# Patient Record
Sex: Female | Born: 1985
Health system: Southern US, Community
[De-identification: ages and names within clinical notes are randomized; demographics above are authoritative.]

## PROBLEM LIST (undated history)

## (undated) DIAGNOSIS — R87629 Unspecified abnormal cytological findings in specimens from vagina: Secondary | ICD-10-CM

---
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
## 2006-03-07 ENCOUNTER — Emergency Department (HOSPITAL_COMMUNITY): Admission: EM | Admit: 2006-03-07 | Discharge: 2006-03-07 | Payer: Self-pay | Admitting: Emergency Medicine

## 2006-08-13 ENCOUNTER — Other Ambulatory Visit: Admission: RE | Admit: 2006-08-13 | Discharge: 2006-08-13 | Payer: Self-pay | Admitting: Obstetrics and Gynecology

## 2008-04-15 HISTORY — PX: FRACTURE SURGERY: SHX138

## 2008-05-01 ENCOUNTER — Inpatient Hospital Stay (HOSPITAL_COMMUNITY): Admission: EM | Admit: 2008-05-01 | Discharge: 2008-05-06 | Payer: Self-pay | Admitting: Emergency Medicine

## 2008-06-15 ENCOUNTER — Ambulatory Visit (HOSPITAL_COMMUNITY): Admission: RE | Admit: 2008-06-15 | Discharge: 2008-06-15 | Payer: Self-pay | Admitting: Orthopedic Surgery

## 2010-04-25 LAB — HEMOGLOBIN AND HEMATOCRIT, BLOOD
HCT: 37.4 % (ref 36.0–46.0)
Hemoglobin: 12.8 g/dL (ref 12.0–15.0)

## 2010-04-26 LAB — COMPREHENSIVE METABOLIC PANEL
Albumin: 2.6 g/dL — ABNORMAL LOW (ref 3.5–5.2)
BUN: 7 mg/dL (ref 6–23)
Calcium: 8.7 mg/dL (ref 8.4–10.5)
Creatinine, Ser: 0.65 mg/dL (ref 0.4–1.2)
Potassium: 4.1 mEq/L (ref 3.5–5.1)
Total Protein: 5.6 g/dL — ABNORMAL LOW (ref 6.0–8.3)

## 2010-04-26 LAB — CBC
HCT: 32.5 % — ABNORMAL LOW (ref 36.0–46.0)
MCV: 91.5 fL (ref 78.0–100.0)
Platelets: 222 10*3/uL (ref 150–400)
RDW: 12.1 % (ref 11.5–15.5)

## 2010-04-26 LAB — URINE MICROSCOPIC-ADD ON

## 2010-04-26 LAB — URINALYSIS, ROUTINE W REFLEX MICROSCOPIC
Glucose, UA: NEGATIVE mg/dL
Protein, ur: 30 mg/dL — AB
pH: 6 (ref 5.0–8.0)

## 2010-05-30 NOTE — Consult Note (Signed)
[name] [name], [name] [name]               ACCOUNT NO.:  [id number]   MEDICAL RECORD NO.:  [id number]          PATIENT TYPE:  INP   LOCATION:  1611                         FACILITY:  [facility]   PHYSICIAN:  Pramod P. Pearlean Brownie, MD    DATE OF BIRTH:  1985/07/25   DATE OF CONSULTATION:  05/06/2008  DATE OF DISCHARGE:                                 CONSULTATION   REASON FOR REFERRAL:  Involuntary head, neck and face movements.   HISTORY OF PRESENT ILLNESS:  Ms. Tyer is a 25 year old pleasant  Caucasian lady who was admitted for elective surgery for a closed  displaced distal tibial and fibular fracture on the right.  She  underwent an intramedullary nailing surgery on 05/02/2008.  She was  getting ready to be discharged today, when at 11:30 a.m. she had noticed  involuntary head and neck movements to the left, as well as some  intermittent jaw and tongue movements as well.  She stated that this  movement is involuntary and she can voluntarily move her head and face  back to the right, but it does not stay there and gets pulled to the  left.  She has notable speaking, swallowing and eating.  She also had  some increased tone in the right thigh earlier, which seems to have now  resolved.  She has no prior history of similar episodes.  She did get  some Reglan 10 mg yesterday for nausea and has been getting narcotics  for pain.   PAST MEDICAL HISTORY:  Significant only for migraine headaches, for  which she has seen Dr. Levert Feinstein in the office.  She was on Topamax for  a short period of time, but has been off of it now for a long time.  She  did have an outpatient MRI scan done in the office, which was  unremarkable.   REVIEW OF SYSTEMS:  Positive for involuntary movement and pain.  No  chest pain, shortness of breath or diarrhea.   PHYSICAL EXAMINATION:  GENERAL:  A young Caucasian lady, who appears to  be in distress.  NEUROLOGIC:  She has intermittent involuntary head movements to the left  with increased tone in the sternocleidomastoid.  The eyes also deviate  at times to the left, but she had easily voluntarily move the eyes back  past the midline.  She has an intermittent jaw dystonia with jaw  division to the left as well.  She has some mild tongue deviation but  she can easily protrude her tongue to command.  She is able to speak  with a soft voice.  She follows commands well.  She has no extremity  dystonia or  weakness.  She moves all four extremities well.  She has a  post-surgical bandage on the left leg.  CARDIAC:  Reveals heart sounds.  LUNGS:  Clear to auscultation.   DATA:  Recent hospital chart was reviewed.  The previous office notes  from Dr. Terrace Arabia were reviewed.   IMPRESSION:  A 25 year old lady with sudden onset of involuntary head  and neck movements to the left, with some intermittent  jaw dystonia as  well.  This likely an acute dystonic reaction to Reglan, which she got  yesterday.   PLAN:  I would recommend IV Cogentin 2 mg x1 now.  If she has recurrent  persistent dystonia, may start Benadryl 25 mg three times daily.  Avoid  antipsychotics and antiemetics in the future.   I will be happy to follow the patient.  Please call for questions.  She  can follow up electively with Dr. Terrace Arabia for her headaches.           ______________________________  Sunny Schlein. Pearlean Brownie, MD     PPS/MEDQ  D:  05/06/2008  T:  05/06/2008  Job:  [id number]
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
## 2010-05-30 NOTE — Discharge Summary (Signed)
[name] [name], [name] [name]               ACCOUNT NO.:  [id number]   MEDICAL RECORD NO.:  [id number]          PATIENT TYPE:  INP   LOCATION:  1611                         FACILITY:  [facility]   PHYSICIAN:  Marlowe Kays, M.D.  DATE OF BIRTH:  08/17/1985   DATE OF ADMISSION:  05/01/2008  DATE OF DISCHARGE:  05/06/2008                               DISCHARGE SUMMARY   ADDENDUM:  This is an addendum to the discharge summary that was  dictated earlier today.   [name] [name] was to have been discharged late morning today to her home.  Her  sister had flown in from [place] to be with her.  After finishing  assisting surgery with Dr. Simonne Come, I was paged and called to the  floor to check out the patient, as she was having some sort of muscle  spasms which caused her head to go into hyperextension and eyes pointing  to the left that she could not control.  I came upstairs.  Her mother  and older sister were in the room.  [name] [name] was lying on the right side  in a recumbent type position, and her sister was seated on the bed,  stroking her hair.  When I walked into the room and spoke to her, she  was able to bring her eyes down and focus on me, and then they  immediately went back up.  Ocular examination reveals no difficulty with  ocular movements following moving light.  All parameters were checked.  She was awake and alert and oriented x3.  She stated that she had no  control over this type spasm.  However, she could put her chin to the  chest with no difficulty.  However, the spasms pulled her head back  into the same position.  Mother and sister were, as one would expect,  quite concerned about this.  I learned that [name] [name] had been seen by Dr.  Terrace Arabia  at [facility] and had actually called their office and  had an appointment to see Dr. Terrace Arabia for her head problems on May 25, 2008.   I felt that since she had these spasm-like situations, and already had  been seen by a neurologist, that it  would be in the patient's best  interest to be seen by a neurologist prior to discharge.  The mother  agreed.  We will therefore hold her discharge until she is seen and  cleared by a neurologist for discharge home.  There is no plan in the  orthopedic plan previously dictated.  If Dr. Pearlean Brownie who is on call clears  her, then she can be discharged home later today, and this will serve as  an addendum to her discharge summary with no change in the previous  summary dictated.      Dooley L. Cherlynn June.    ______________________________  Marlowe Kays, M.D.    DLU/MEDQ  D:  05/06/2008  T:  05/06/2008  Job:  [id number]   cc:   Levert Feinstein, MD
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
## 2010-06-02 NOTE — Discharge Summary (Signed)
NAMEBRETTNEY, [name] [name]               ACCOUNT NO.:  [id number]   MEDICAL RECORD NO.:  [id number]          PATIENT TYPE:  INP   LOCATION:  1611                         FACILITY:  [facility]   PHYSICIAN:  Marlowe Kays, M.D.  DATE OF BIRTH:  [birth year]   DATE OF ADMISSION:  05/01/2008  DATE OF DISCHARGE:  05/06/2008                               DISCHARGE SUMMARY   ADDENDUM:  After my last addendum Dr. Pearlean Brownie did come and consult with  the patient, [name].  He felt that the involuntary head and neck  movement to the left knee primarily was a acute dystonic reaction to the  Reglan that was given to her for her nausea.  He treated her with IV  Cogentin 1 dose and for persistent dystonia Benadryl 25 mg t.i.d.  He  recommended she avoid antipsychotics and antiemetics in the future.  He  offered her follow up to his office and to follow-up with Dr. Terrace Arabia at  [facility]. The original plan on the original discharge summary  is as dictated.      Dooley L. Cherlynn June.    ______________________________  Marlowe Kays, M.D.    DLU/MEDQ  D:  05/07/2008  T:  05/07/2008  Job:  [id number]   cc:   Levert Feinstein, MD   Marlowe Kays, M.D.  Fax: [phone]

## 2011-02-08 IMAGING — RF DG ANKLE 2V *R*
1 series · 6 of 6 positions shown · non-contrast
Comparison: 05/01/2008

CLINICAL DATA: Ankle fracture

RIGHT ANKLE - 2 VIEW

[Series 1: run · 6 of 6 slices shown]
[im 1/6]
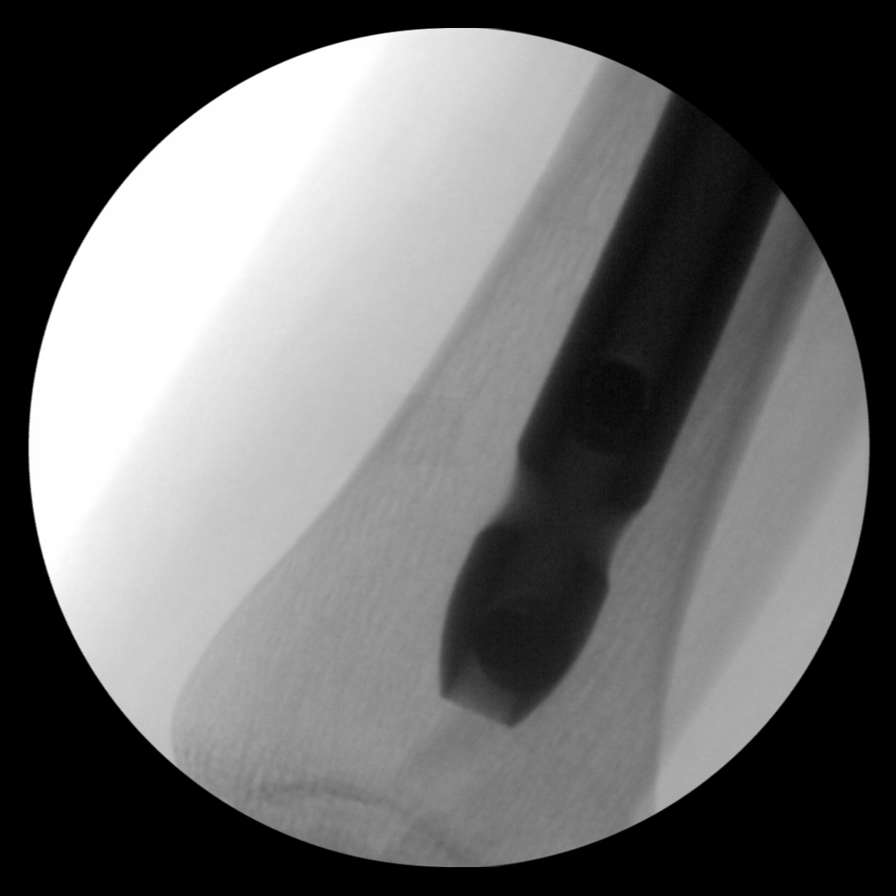
[im 2/6]
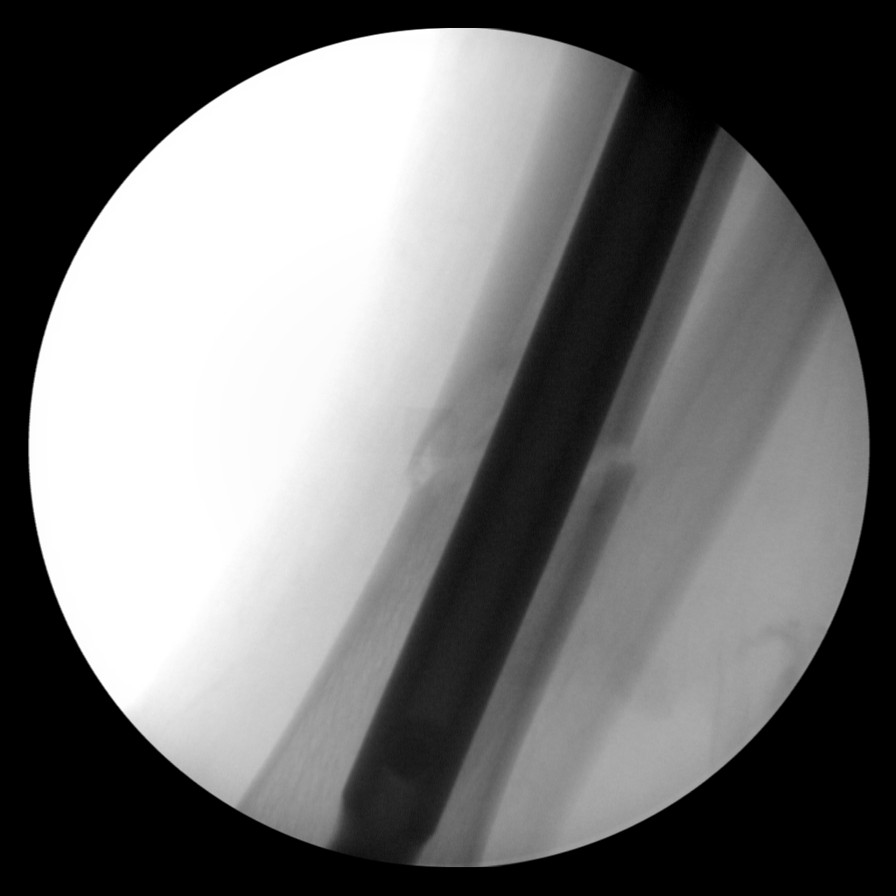
[im 3/6]
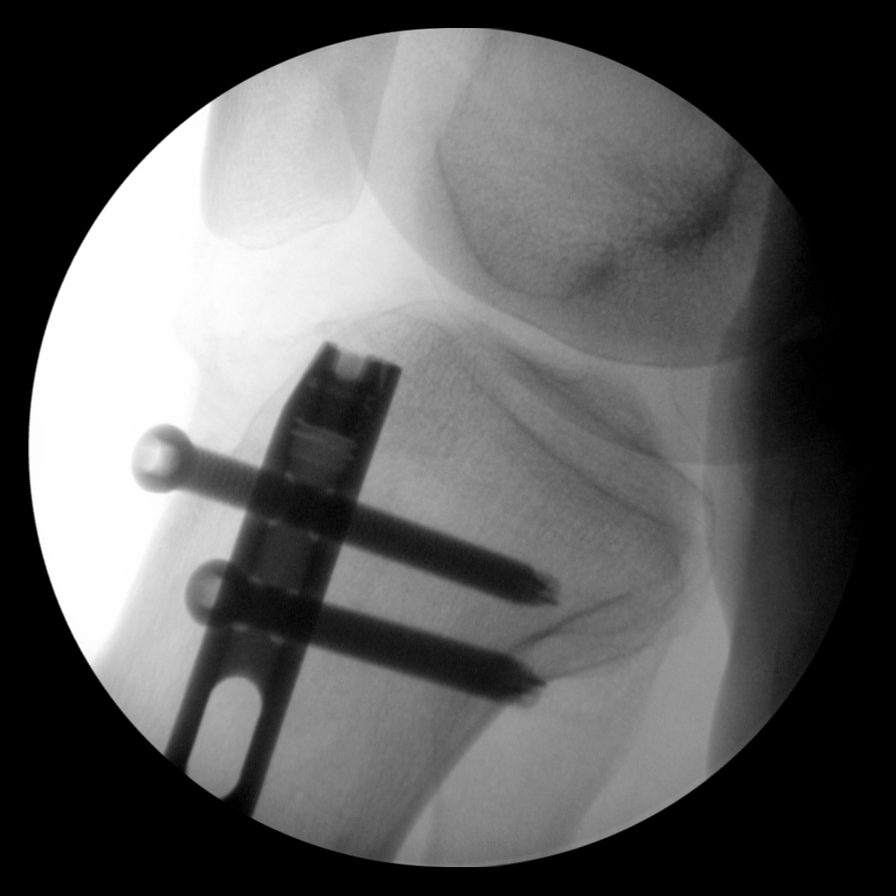
[im 4/6]
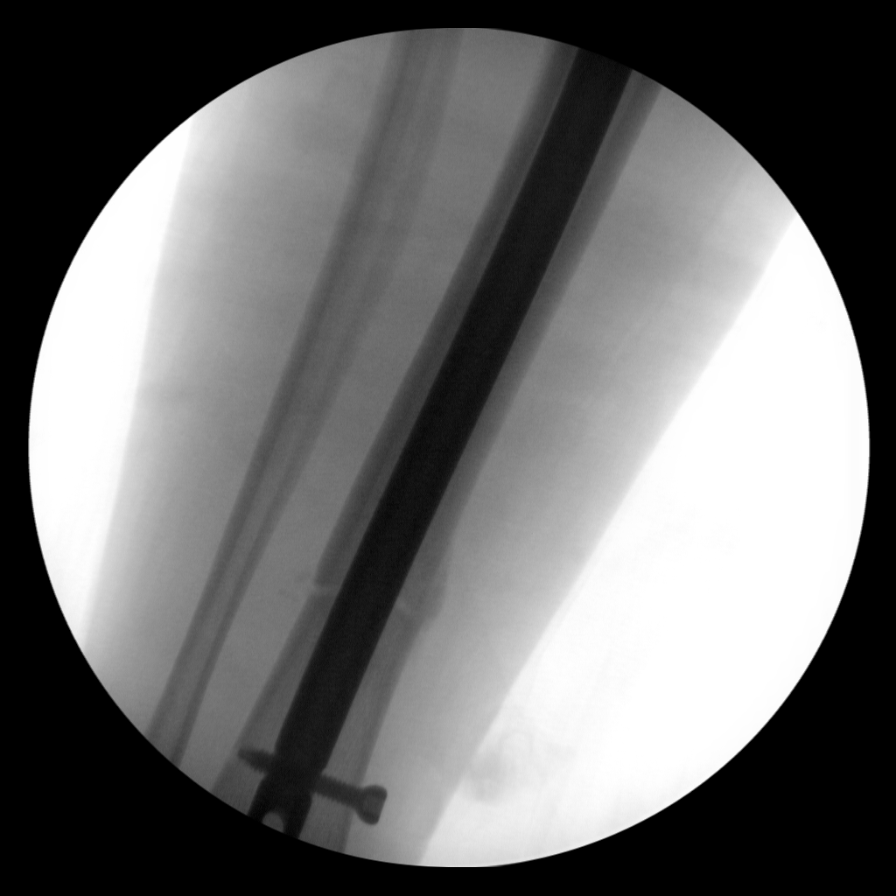
[im 5/6]
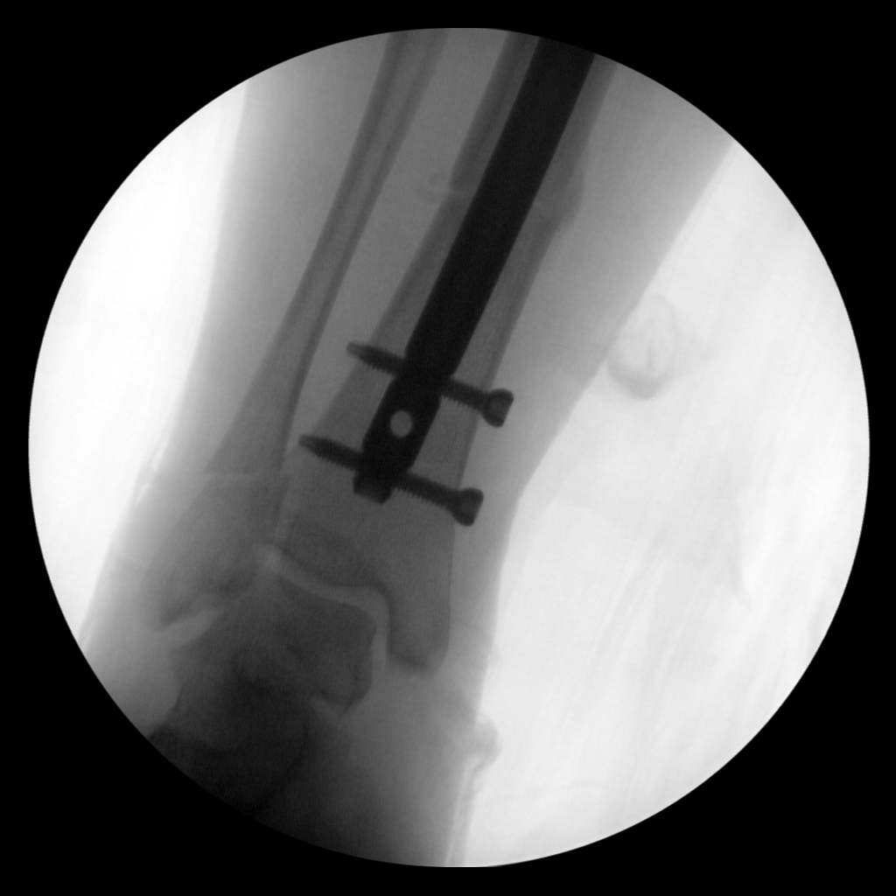
[im 6/6]
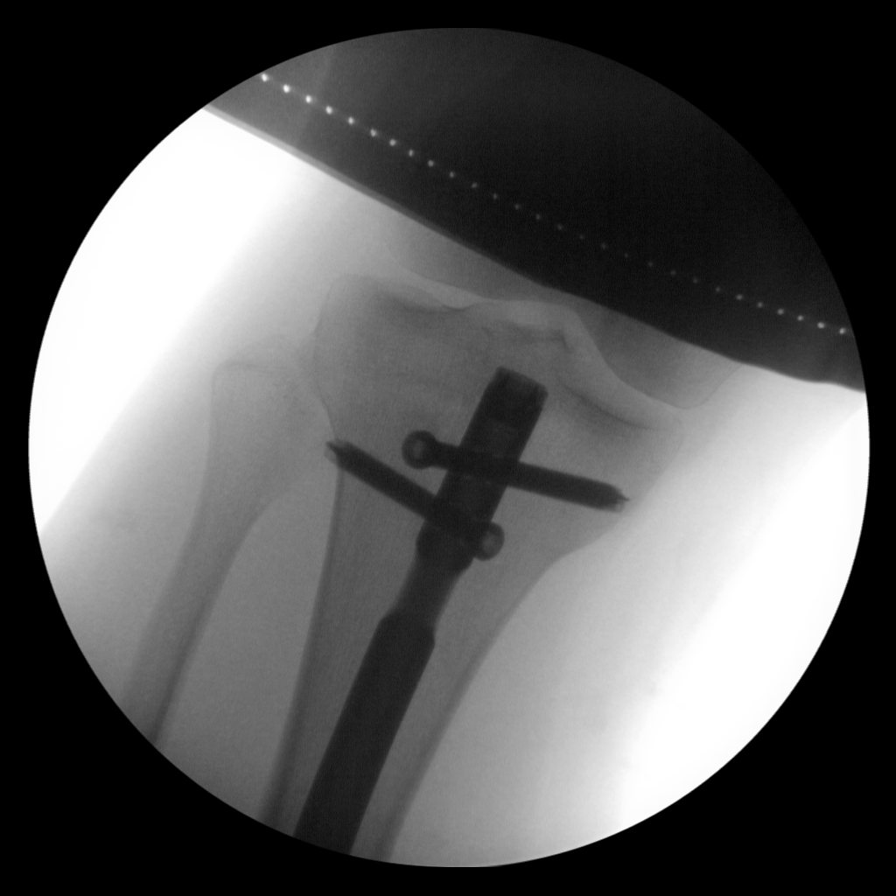

[6 of 6 positions shown; findings below may reference images not displayed]

FINDINGS: Six spot images demonstrate placement of an
intramedullary rod transfixing a distal tibia diaphyseal fracture.
There is anatomic alignment.  There are to distal and to proximal
interlocking screws.  There is an associated distal fibula fracture
with near anatomic alignment.  No breakage or loosening of the
hardware.
IMPRESSION: ORIF tibial fracture.

## 2014-11-04 LAB — OB RESULTS CONSOLE HEPATITIS B SURFACE ANTIGEN: Hepatitis B Surface Ag: NEGATIVE

## 2014-11-04 LAB — OB RESULTS CONSOLE ABO/RH: RH TYPE: POSITIVE

## 2014-11-04 LAB — OB RESULTS CONSOLE RUBELLA ANTIBODY, IGM: Rubella: IMMUNE

## 2014-11-04 LAB — OB RESULTS CONSOLE RPR: RPR: NONREACTIVE

## 2014-11-04 LAB — OB RESULTS CONSOLE GC/CHLAMYDIA
Chlamydia: NEGATIVE
Gonorrhea: NEGATIVE

## 2014-11-04 LAB — OB RESULTS CONSOLE HIV ANTIBODY (ROUTINE TESTING): HIV: NONREACTIVE

## 2015-01-16 NOTE — L&D Delivery Note (Signed)
Delivery Note  SVD viable female Apgars 9,9 over 2nd deg ML lac.  Placenta delivered spontaneously intact with 3VC. Repair with 2-0 Chromic with good support and hemostasis noted and R/V exam confirms.  PH art was sent.  [place] cord blood was done.  Mother and baby were doing well.  EBL 200cc  Candice Campavid Senai Kingsley, MD

## 2015-02-23 MED FILL — AZITHROMYCIN 250 MG TABLET: 250 | 5 days supply | Qty: 6 | Fill #0

## 2015-05-12 LAB — OB RESULTS CONSOLE GBS: STREP GROUP B AG: POSITIVE

## 2015-06-02 ENCOUNTER — Inpatient Hospital Stay (HOSPITAL_COMMUNITY)
Admission: AD | Admit: 2015-06-02 | Discharge: 2015-06-05 | DRG: 775 | Disposition: A | Discharge status: Home / Self Care | Payer: BLUE CROSS/BLUE SHIELD | Source: Ambulatory Visit | Attending: Obstetrics and Gynecology | Admitting: Obstetrics and Gynecology

## 2015-06-02 ENCOUNTER — Inpatient Hospital Stay (HOSPITAL_COMMUNITY): Payer: BLUE CROSS/BLUE SHIELD | Admitting: Anesthesiology

## 2015-06-02 ENCOUNTER — Encounter (HOSPITAL_COMMUNITY): Payer: Self-pay | Admitting: *Deleted

## 2015-06-02 DIAGNOSIS — O4202 Full-term premature rupture of membranes, onset of labor within 24 hours of rupture: Secondary | ICD-10-CM | POA: Diagnosis present

## 2015-06-02 DIAGNOSIS — O99824 Streptococcus B carrier state complicating childbirth: Secondary | ICD-10-CM | POA: Diagnosis present

## 2015-06-02 DIAGNOSIS — Z3A38 38 weeks gestation of pregnancy: Secondary | ICD-10-CM

## 2015-06-02 HISTORY — DX: Unspecified abnormal cytological findings in specimens from vagina: R87.629

## 2015-06-02 LAB — CBC
HCT: 32.5 % — ABNORMAL LOW (ref 36.0–46.0)
Hemoglobin: 10.7 g/dL — ABNORMAL LOW (ref 12.0–15.0)
MCH: 29.2 pg (ref 26.0–34.0)
MCHC: 32.9 g/dL (ref 30.0–36.0)
MCV: 88.6 fL (ref 78.0–100.0)
PLATELETS: 243 10*3/uL (ref 150–400)
RBC: 3.67 MIL/uL — AB (ref 3.87–5.11)
RDW: 13.9 % (ref 11.5–15.5)
WBC: 8.4 10*3/uL (ref 4.0–10.5)

## 2015-06-02 LAB — ABO/RH: ABO/RH(D): A POS

## 2015-06-02 LAB — TYPE AND SCREEN
ABO/RH(D): A POS
ANTIBODY SCREEN: NEGATIVE

## 2015-06-02 MED ORDER — ACETAMINOPHEN 325 MG PO TABS
650.0000 mg | ORAL_TABLET | ORAL | Status: DC | PRN
Start: 2015-06-02 — End: 2015-06-03

## 2015-06-02 MED ORDER — LIDOCAINE HCL (PF) 1 % IJ SOLN
INTRAMUSCULAR | Status: DC | PRN
  Administered 2015-06-02 (×2): 4 mL

## 2015-06-02 MED ORDER — OXYTOCIN BOLUS FROM INFUSION
500.0000 mL | INTRAVENOUS | Status: DC
  Administered 2015-06-03: 500 mL via INTRAVENOUS

## 2015-06-02 MED ORDER — DIPHENHYDRAMINE HCL 50 MG/ML IJ SOLN
12.5000 mg | INTRAMUSCULAR | Status: DC | PRN
  Administered 2015-06-02: 12.5 mg via INTRAVENOUS
  Filled 2015-06-02: qty 1

## 2015-06-02 MED ORDER — LIDOCAINE HCL (PF) 1 % IJ SOLN
30.0000 mL | INTRAMUSCULAR | Status: DC | PRN
  Filled 2015-06-02: qty 30

## 2015-06-02 MED ORDER — EPHEDRINE 5 MG/ML INJ
10.0000 mg | INTRAVENOUS | Status: DC | PRN
  Filled 2015-06-02: qty 2

## 2015-06-02 MED ORDER — LACTATED RINGERS IV SOLN: 500.0000 mL | INTRAVENOUS | Status: DC | PRN

## 2015-06-02 MED ORDER — OXYCODONE-ACETAMINOPHEN 5-325 MG PO TABS: 1.0000 | ORAL_TABLET | ORAL | Status: DC | PRN

## 2015-06-02 MED ORDER — LACTATED RINGERS IV SOLN: 500.0000 mL | Freq: Once | INTRAVENOUS | Status: DC

## 2015-06-02 MED ORDER — ONDANSETRON HCL 4 MG/2ML IJ SOLN
4.0000 mg | Freq: Four times a day (QID) | INTRAMUSCULAR | Status: DC | PRN
  Administered 2015-06-03: 4 mg via INTRAVENOUS
  Filled 2015-06-02: qty 2

## 2015-06-02 MED ORDER — PENICILLIN G POTASSIUM 5000000 UNITS IJ SOLR
5.0000 10*6.[IU] | Freq: Once | INTRAVENOUS | Status: AC
  Administered 2015-06-02: 5 10*6.[IU] via INTRAVENOUS
  Filled 2015-06-02: qty 5

## 2015-06-02 MED ORDER — LACTATED RINGERS IV SOLN
INTRAVENOUS | Status: DC
  Administered 2015-06-02 (×2): via INTRAVENOUS

## 2015-06-02 MED ORDER — FENTANYL 2.5 MCG/ML BUPIVACAINE 1/10 % EPIDURAL INFUSION (WH - ANES)
14.0000 mL/h | INTRAMUSCULAR | Status: DC | PRN
  Administered 2015-06-02 (×3): 14 mL/h via EPIDURAL
  Filled 2015-06-02 (×2): qty 125

## 2015-06-02 MED ORDER — OXYTOCIN 40 UNITS IN LACTATED RINGERS INFUSION - SIMPLE MED: 2.5000 [IU]/h | INTRAVENOUS | Status: DC

## 2015-06-02 MED ORDER — CITRIC ACID-SODIUM CITRATE 334-500 MG/5ML PO SOLN: 30.0000 mL | ORAL | Status: DC | PRN

## 2015-06-02 MED ORDER — PHENYLEPHRINE 40 MCG/ML (10ML) SYRINGE FOR IV PUSH (FOR BLOOD PRESSURE SUPPORT)
80.0000 ug | PREFILLED_SYRINGE | INTRAVENOUS | Status: DC | PRN
  Filled 2015-06-02: qty 10
  Filled 2015-06-02: qty 5

## 2015-06-02 MED ORDER — PENICILLIN G POTASSIUM 5000000 UNITS IJ SOLR
2.5000 10*6.[IU] | INTRAVENOUS | Status: DC
  Administered 2015-06-02 – 2015-06-03 (×2): 2.5 10*6.[IU] via INTRAVENOUS
  Filled 2015-06-02 (×8): qty 2.5

## 2015-06-02 MED ORDER — PHENYLEPHRINE 40 MCG/ML (10ML) SYRINGE FOR IV PUSH (FOR BLOOD PRESSURE SUPPORT)
80.0000 ug | PREFILLED_SYRINGE | INTRAVENOUS | Status: DC | PRN
  Filled 2015-06-02: qty 5

## 2015-06-02 MED ORDER — OXYCODONE-ACETAMINOPHEN 5-325 MG PO TABS: 2.0000 | ORAL_TABLET | ORAL | Status: DC | PRN

## 2015-06-02 MED ORDER — TERBUTALINE SULFATE 1 MG/ML IJ SOLN
0.2500 mg | Freq: Once | INTRAMUSCULAR | Status: DC | PRN
  Filled 2015-06-02: qty 1

## 2015-06-02 MED ORDER — OXYTOCIN 40 UNITS IN LACTATED RINGERS INFUSION - SIMPLE MED
1.0000 m[IU]/min | INTRAVENOUS | Status: DC
  Administered 2015-06-02: 2 m[IU]/min via INTRAVENOUS
  Filled 2015-06-02: qty 1000

## 2015-06-02 NOTE — H&P (Signed)
[name] KohlerChelsea L [name] is a 30 y.o. female presenting for SROM 0830 (PROM).  Came to office with leaking since 0830.  Positive pool and nitrazine.  Preg uncomplicated.  GBS+. History OB History    Gravida Para Term Preterm AB TAB SAB Ectopic Multiple Living   1 0 0 0 0 0 0 0 0 0      Past Medical History  Diagnosis Date  . Vaginal Pap smear, abnormal     repeat was normal; had coloposcopy   No past surgical history on file. Family History: family history is not on file. Social History:  has no tobacco, alcohol, and drug history on file.   Prenatal Transfer Tool  Maternal Diabetes: No Genetic Screening: Normal Maternal Ultrasounds/Referrals: Normal Fetal Ultrasounds or other Referrals:  None Maternal Substance Abuse:  No Significant Maternal Medications:  None Significant Maternal Lab Results:  Lab values include: Other:  Other Comments:  None  ROS  Dilation: 2 Effacement (%): 80 Station: -2 Exam by:: Dr. Rana SnareLowe Blood pressure 130/93, pulse 106, temperature 97.9 F (36.6 C), temperature source Oral, resp. rate 20. Exam Physical Exam  Prenatal labs: ABO, Rh:   Antibody:   Rubella:   RPR:    HBsAg:    HIV:    GBS:     Assessment/Plan: IUP at term PROM.  Pitocin prn.  Pt desires epidural IV abx for GBS   Garlon Tuggle C 06/02/2015, 1:36 PM

## 2015-06-02 NOTE — Anesthesia Pain Management Evaluation Note (Signed)
  [facility] Pain Management Visit Note  Patient: [name] KohlerChelsea L [name], 30 y.o., female  "Hello I am a member of the anesthesia team at [facility]. We have an anesthesia team available at all times to provide care throughout the hospital, including epidural management and anesthesia for C-section. I don't know your plan for the delivery whether it a natural birth, water birth, IV sedation, nitrous supplementation, doula or epidural, but we want to meet your pain goals."   1.Was your pain managed to your expectations on prior hospitalizations?   No   2.What is your expectation for pain management during this hospitalization?     Epidural  3.How can we help you reach that goal? epidural  Record the patient's initial score and the patient's pain goal.   Pain: 0  Pain Goal: 8 [facility] wants you to be able to say your pain was always managed very well.  Cephus ShellingBURGER,Mika Griffitts 06/02/2015

## 2015-06-02 NOTE — Anesthesia Procedure Notes (Signed)
Epidural Patient location during procedure: OB  Staffing Anesthesiologist: Davione Lenker Performed by: anesthesiologist   Preanesthetic Checklist Completed: patient identified, site marked, surgical consent, pre-op evaluation, timeout performed, IV checked, risks and benefits discussed and monitors and equipment checked  Epidural Patient position: sitting Prep: site prepped and draped and DuraPrep Patient monitoring: continuous pulse ox and blood pressure Approach: midline Location: L3-L4 Injection technique: LOR saline  Needle:  Needle type: Tuohy  Needle gauge: 17 G Needle length: 9 cm and 9 Needle insertion depth: 5 cm cm Catheter type: closed end flexible Catheter size: 19 Gauge Catheter at skin depth: 10 cm Test dose: negative  Assessment Events: blood not aspirated, injection not painful, no injection resistance, negative IV test and no paresthesia  Additional Notes Patient identified. Risks/Benefits/Options discussed with patient including but not limited to bleeding, infection, nerve damage, paralysis, failed block, incomplete pain control, headache, blood pressure changes, nausea, vomiting, reactions to medication both or allergic, itching and postpartum back pain. Confirmed with bedside nurse the patient's most recent platelet count. Confirmed with patient that they are not currently taking any anticoagulation, have any bleeding history or any family history of bleeding disorders. Patient expressed understanding and wished to proceed. All questions were answered. Sterile technique was used throughout the entire procedure. Please see nursing notes for vital signs. Test dose was given through epidural catheter and negative prior to continuing to dose epidural or start infusion. Warning signs of high block given to the patient including shortness of breath, tingling/numbness in hands, complete motor block, or any concerning symptoms with instructions to call for help. Patient was  given instructions on fall risk and not to get out of bed. All questions and concerns addressed with instructions to call with any issues or inadequate analgesia.      

## 2015-06-02 NOTE — Anesthesia Preprocedure Evaluation (Signed)

## 2015-06-03 ENCOUNTER — Encounter (HOSPITAL_COMMUNITY): Payer: Self-pay | Admitting: *Deleted

## 2015-06-03 LAB — CBC
HCT: 31.8 % — ABNORMAL LOW (ref 36.0–46.0)
Hemoglobin: 10.4 g/dL — ABNORMAL LOW (ref 12.0–15.0)
MCH: 28.8 pg (ref 26.0–34.0)
MCHC: 32.7 g/dL (ref 30.0–36.0)
MCV: 88.1 fL (ref 78.0–100.0)
PLATELETS: 226 10*3/uL (ref 150–400)
RBC: 3.61 MIL/uL — ABNORMAL LOW (ref 3.87–5.11)
RDW: 14 % (ref 11.5–15.5)
WBC: 14.6 10*3/uL — AB (ref 4.0–10.5)

## 2015-06-03 LAB — RPR: RPR: NONREACTIVE

## 2015-06-03 MED ORDER — COCONUT OIL OIL
1.0000 "application " | TOPICAL_OIL | Status: DC | PRN
  Administered 2015-06-04: 1 via TOPICAL
  Filled 2015-06-03: qty 120

## 2015-06-03 MED ORDER — ZOLPIDEM TARTRATE 5 MG PO TABS: 5.0000 mg | ORAL_TABLET | Freq: Every evening | ORAL | Status: DC | PRN

## 2015-06-03 MED ORDER — ONDANSETRON HCL 4 MG/2ML IJ SOLN: 4.0000 mg | INTRAMUSCULAR | Status: DC | PRN

## 2015-06-03 MED ORDER — PRENATAL MULTIVITAMIN CH
1.0000 | ORAL_TABLET | Freq: Every day | ORAL | Status: DC
  Administered 2015-06-03 – 2015-06-04 (×2): 1 via ORAL
  Filled 2015-06-03 (×2): qty 1

## 2015-06-03 MED ORDER — IBUPROFEN 600 MG PO TABS
600.0000 mg | ORAL_TABLET | Freq: Four times a day (QID) | ORAL | Status: DC
  Administered 2015-06-03 – 2015-06-05 (×9): 600 mg via ORAL
  Filled 2015-06-03 (×10): qty 1

## 2015-06-03 MED ORDER — SENNOSIDES-DOCUSATE SODIUM 8.6-50 MG PO TABS
2.0000 | ORAL_TABLET | ORAL | Status: DC
  Administered 2015-06-03 – 2015-06-05 (×2): 2 via ORAL
  Filled 2015-06-03 (×2): qty 2

## 2015-06-03 MED ORDER — TETANUS-DIPHTH-ACELL PERTUSSIS 5-2.5-18.5 LF-MCG/0.5 IM SUSP: 0.5000 mL | Freq: Once | INTRAMUSCULAR | Status: DC

## 2015-06-03 MED ORDER — MEASLES, MUMPS & RUBELLA VAC ~~LOC~~ INJ
0.5000 mL | INJECTION | Freq: Once | SUBCUTANEOUS | Status: DC
  Filled 2015-06-03: qty 0.5

## 2015-06-03 MED ORDER — OXYCODONE-ACETAMINOPHEN 5-325 MG PO TABS
1.0000 | ORAL_TABLET | ORAL | Status: DC | PRN
  Administered 2015-06-03 – 2015-06-05 (×4): 1 via ORAL
  Filled 2015-06-03 (×4): qty 1

## 2015-06-03 MED ORDER — ACETAMINOPHEN 325 MG PO TABS
650.0000 mg | ORAL_TABLET | ORAL | Status: DC | PRN
  Filled 2015-06-03: qty 2

## 2015-06-03 MED ORDER — ONDANSETRON HCL 4 MG PO TABS: 4.0000 mg | ORAL_TABLET | ORAL | Status: DC | PRN

## 2015-06-03 MED ORDER — DIPHENHYDRAMINE HCL 25 MG PO CAPS
25.0000 mg | ORAL_CAPSULE | Freq: Four times a day (QID) | ORAL | Status: DC | PRN
  Administered 2015-06-03: 25 mg via ORAL
  Filled 2015-06-03: qty 1

## 2015-06-03 MED ORDER — BENZOCAINE-MENTHOL 20-0.5 % EX AERO
1.0000 "application " | INHALATION_SPRAY | CUTANEOUS | Status: DC | PRN
  Administered 2015-06-03: 1 via TOPICAL
  Filled 2015-06-03: qty 56

## 2015-06-03 MED ORDER — SIMETHICONE 80 MG PO CHEW: 80.0000 mg | CHEWABLE_TABLET | ORAL | Status: DC | PRN

## 2015-06-03 MED ORDER — MEDROXYPROGESTERONE ACETATE 150 MG/ML IM SUSP: 150.0000 mg | INTRAMUSCULAR | Status: DC | PRN

## 2015-06-03 MED ORDER — OXYCODONE-ACETAMINOPHEN 5-325 MG PO TABS
2.0000 | ORAL_TABLET | ORAL | Status: DC | PRN
  Administered 2015-06-04: 2 via ORAL
  Filled 2015-06-03: qty 2

## 2015-06-03 MED ORDER — DIBUCAINE 1 % RE OINT: 1.0000 "application " | TOPICAL_OINTMENT | RECTAL | Status: DC | PRN

## 2015-06-03 MED ORDER — WITCH HAZEL-GLYCERIN EX PADS: 1.0000 "application " | MEDICATED_PAD | CUTANEOUS | Status: DC | PRN

## 2015-06-03 NOTE — Progress Notes (Signed)
Post Partum Day 1 Subjective: no complaints  Objective: Blood pressure 107/66, pulse 96, temperature 99 F (37.2 C), temperature source Oral, resp. rate 20, height 5\' 3"  (1.6 m), weight 194 lb (87.998 kg), SpO2 100 %, unknown if currently breastfeeding.  Physical Exam:  General: alert and cooperative Lochia: appropriate Uterine Fundus: firm DVT Evaluation: No evidence of DVT seen on physical exam.   Recent Labs  06/02/15 1321 06/03/15 0627  HGB 10.7* 10.4*  HCT 32.5* 31.8*    Assessment/Plan: Plan for discharge tomorrow and Breastfeeding   LOS: 1 day   [name] [name] 06/03/2015, 8:59 AM

## 2015-06-03 NOTE — Lactation Note (Signed)
This note was copied from a baby's chart. Lactation Consultation Note  Patient Name: [name] Allyne GeeChelsea Salmons ZOXWR'UToday's Date: 06/03/2015 Reason for consult: Initial assessment Baby sleepy at this visit. Assisted Mom with hand expression and attempted to latch baby. Baby took few suckles but not interested in sustaining latch. Baby a little gaggy. Some tongue thrusting noted. Placed baby STS on Mom. Basic teaching reviewed. Advised to BF with feeding ques. Lactation brochure left for review, advised of OP services and support group. Encouraged to call for assist with latch next feeding. Mom brought her own nipple shield and RN last night gave Mom nipple shield. Did not use at this visit. Mom's nipples are erect, slightly short shaft, very compressible. Will see if latch improves when baby more awake before trying nipple shield.   Maternal Data Has patient been taught Hand Expression?: Yes Does the patient have breastfeeding experience prior to this delivery?: No  Feeding Feeding Type: Breast Fed Length of feed: 0 min  LATCH Score/Interventions Latch: Too sleepy or reluctant, no latch achieved, no sucking elicited. Intervention(s): Skin to skin;Waking techniques;Teach feeding cues     Type of Nipple: Everted at rest and after stimulation  Comfort (Breast/Nipple): Soft / non-tender     Hold (Positioning): Assistance needed to correctly position infant at breast and maintain latch. Intervention(s): Breastfeeding basics reviewed;Support Pillows;Position options;Skin to skin     Lactation Tools Discussed/Used Tools: Pump Breast pump type: Manual   Consult Status Consult Status: Follow-up Date: 06/04/15 Follow-up type: In-patient    Alfred LevinsGranger, Alexis Mizuno Ann 06/03/2015, 2:36 PM

## 2015-06-03 NOTE — Anesthesia Postprocedure Evaluation (Signed)
Anesthesia Post Note  Patient: [name] KohlerChelsea L [name]  Procedure(s) Performed: * No procedures listed *  Patient location during evaluation: Mother Baby Anesthesia Type: Epidural Level of consciousness: awake and alert and oriented Pain management: pain level controlled Vital Signs Assessment: post-procedure vital signs reviewed and stable Respiratory status: spontaneous breathing and nonlabored ventilation Cardiovascular status: stable Postop Assessment: epidural receding, patient able to bend at knees, no signs of nausea or vomiting and adequate PO intake Anesthetic complications: no     Last Vitals:  Filed Vitals:   06/03/15 0530 06/03/15 0635  BP: 108/53 107/66  Pulse: 91 96  Temp: 36.9 C 37.2 C  Resp: 18 20    Last Pain:  Filed Vitals:   06/03/15 0804  PainSc: 0-No pain   Pain Goal:                 Land O'LakesMalinova,Peri Kreft Hristova

## 2015-06-04 MED ORDER — OXYCODONE-ACETAMINOPHEN 5-325 MG PO TABS: 1.0000 | ORAL_TABLET | ORAL | Status: AC | PRN

## 2015-06-04 MED ORDER — IBUPROFEN 600 MG PO TABS: 600.0000 mg | ORAL_TABLET | Freq: Four times a day (QID) | ORAL | Status: AC

## 2015-06-04 NOTE — Discharge Summary (Signed)
Obstetric Discharge Summary Reason for Admission: onset of labor Prenatal Procedures: ultrasound Intrapartum Procedures: spontaneous vaginal delivery Postpartum Procedures: none Complications-Operative and Postpartum: none HEMOGLOBIN  Date Value Ref Range Status  06/03/2015 10.4* 12.0 - 15.0 g/dL Final   HCT  Date Value Ref Range Status  06/03/2015 31.8* 36.0 - 46.0 % Final    Physical Exam:  General: alert and cooperative Lochia: appropriate Uterine Fundus: firm Incision: no significant drainage DVT Evaluation: No evidence of DVT seen on physical exam.  Discharge Diagnoses: Term Pregnancy-delivered  Discharge Information: Date: 06/04/2015 Activity: pelvic rest Diet: routine Medications: PNV, Ibuprofen and Percocet Condition: stable Instructions: refer to practice specific booklet Discharge to: home Follow-up Information    Schedule an appointment as soon as possible for a visit in 2 weeks to follow up.      Newborn Data: Live born female  Birth Weight: 6 lb 11.4 oz (3045 g) APGAR: 9, 9  Home with mother.  [name] [name] 06/04/2015, 8:42 AM

## 2015-06-04 NOTE — Lactation Note (Signed)
This note was copied from a baby's chart. Lactation Consultation Note  Baby fussy and crying.  Attempted latching but baby would not sustain latch. Burped baby and gave baby drops of colostrum on spoon. Baby latched on L side.  Sucks and swallows observed. Suggest mother try burping before latching and give taste of colostrum if needed.    Patient Name: [name] [name] ZOXWR'UToday's Date: 06/04/2015 Reason for consult: FoAllyne Geellow-up assessment   Maternal Data    Feeding Feeding Type: Breast Fed  LATCH Score/Interventions Latch: Repeated attempts needed to sustain latch, nipple held in mouth throughout feeding, stimulation needed to elicit sucking reflex. Intervention(s): Skin to skin Intervention(s): Adjust position;Assist with latch;Breast massage;Breast compression  Audible Swallowing: Spontaneous and intermittent Intervention(s): Skin to skin  Type of Nipple: Everted at rest and after stimulation  Comfort (Breast/Nipple): Filling, red/small blisters or bruises, mild/mod discomfort     Hold (Positioning): Assistance needed to correctly position infant at breast and maintain latch.  LATCH Score: 7  Lactation Tools Discussed/Used     Consult Status Consult Status: Follow-up Date: 06/05/15 Follow-up type: In-patient    Dahlia ByesBerkelhammer, Ruth [facility] 06/04/2015, 3:45 PM

## 2015-06-04 NOTE — Lactation Note (Signed)
This note was copied from a baby's chart. Lactation Consultation Note  Baby latched upon entering and then came off. Noted baby has disorganized suck and mother needs to compress breast to keep baby on deep. Helped mother with compressing breast to achieve a deeper latch and massage breast during feeding to keep her active. Encouraged mother to change positions.  Switched to cross cradle and football. Sucks and swallows observed. Praised mother for her efforts and described cluster feeding and suggest she rest after feeding. Mother can easily express breastmilk. Told her to call if she needs more assistance w/ breastfeeding. Mother's nipples are tender.  RN will give coconut oil.  Suggest ebm.   Patient Name: Girl Allyne GeeChelsea Tomeo ZOXWR'UToday's Date: 06/04/2015 Reason for consult: Follow-up assessment   Maternal Data    Feeding Feeding Type: Breast Fed Length of feed: 15 min  LATCH Score/Interventions Latch: Grasps breast easily, tongue down, lips flanged, rhythmical sucking.  Audible Swallowing: A few with stimulation  Type of Nipple: Everted at rest and after stimulation  Comfort (Breast/Nipple): Filling, red/small blisters or bruises, mild/mod discomfort     Hold (Positioning): Assistance needed to correctly position infant at breast and maintain latch.  LATCH Score: 7  Lactation Tools Discussed/Used     Consult Status Consult Status: Follow-up Date: 06/05/15 Follow-up type: In-patient    Dahlia ByesBerkelhammer, Braelin [facility] 06/04/2015, 11:09 AM

## 2015-06-05 NOTE — Lactation Note (Signed)
This note was copied from a baby's chart. Lactation Consultation Note  Mother breastfeeding upon entering.  Sucks and swallows observed. Mother states breastfeeding has greatly improved. Mother has been breastfeeding on both breasts and massaging breasts during feeding. Asked her about pumping and she stated she was discouraged from pumping from RN. Recommend instead of supplementing w/ formula recommend pumping and giving baby back volume pumped. She has her own DEBP. Discussed milk storage. Reviewed engorgement care and monitoring voids/stools. Made OP appointment on 5/26 2:30p.   Patient Name: Girl Allyne GeeChelsea Hardwick ZOXWR'UToday's Date: 06/05/2015 Reason for consult: Follow-up assessment   Maternal Data    Feeding Feeding Type: Breast Fed  LATCH Score/Interventions Latch: Grasps breast easily, tongue down, lips flanged, rhythmical sucking. Intervention(s): Waking techniques  Audible Swallowing: A few with stimulation  Type of Nipple: Everted at rest and after stimulation  Comfort (Breast/Nipple): Filling, red/small blisters or bruises, mild/mod discomfort     Hold (Positioning): No assistance needed to correctly position infant at breast.  LATCH Score: 8  Lactation Tools Discussed/Used     Consult Status Consult Status: Follow-up Date: 06/06/15 Follow-up type: Out-patient    Dahlia ByesBerkelhammer, Ruth [facility] 06/05/2015, 10:40 AM

## 2015-06-10 ENCOUNTER — Ambulatory Visit (HOSPITAL_COMMUNITY): Admit: 2015-06-10 | Payer: BLUE CROSS/BLUE SHIELD

## 2018-11-11 ENCOUNTER — Other Ambulatory Visit: Payer: Self-pay

## 2018-11-11 DIAGNOSIS — Z20822 Contact with and (suspected) exposure to covid-19: Secondary | ICD-10-CM

## 2018-11-13 LAB — NOVEL CORONAVIRUS, NAA: SARS-CoV-2, NAA: NOT DETECTED

## 2022-01-12 ENCOUNTER — Telehealth: Payer: BLUE CROSS/BLUE SHIELD

## 2022-01-14 ENCOUNTER — Telehealth: Payer: BC Managed Care – PPO | Admitting: Physician Assistant

## 2022-01-14 DIAGNOSIS — R051 Acute cough: Secondary | ICD-10-CM

## 2022-01-14 DIAGNOSIS — B9689 Other specified bacterial agents as the cause of diseases classified elsewhere: Secondary | ICD-10-CM | POA: Diagnosis not present

## 2022-01-14 DIAGNOSIS — H6690 Otitis media, unspecified, unspecified ear: Secondary | ICD-10-CM

## 2022-01-14 DIAGNOSIS — J019 Acute sinusitis, unspecified: Secondary | ICD-10-CM

## 2022-01-14 MED ORDER — PROMETHAZINE-DM 6.25-15 MG/5ML PO SYRP: 5.0000 mL | ORAL_SOLUTION | Freq: Four times a day (QID) | ORAL | 0 refills | Status: AC | PRN

## 2022-01-14 MED ORDER — FLUTICASONE PROPIONATE 50 MCG/ACT NA SUSP: 2.0000 | Freq: Every day | NASAL | 0 refills | Status: AC

## 2022-01-14 MED ORDER — BENZONATATE 100 MG PO CAPS: 100.0000 mg | ORAL_CAPSULE | Freq: Three times a day (TID) | ORAL | 0 refills | Status: AC | PRN

## 2022-01-14 MED ORDER — AMOXICILLIN-POT CLAVULANATE 875-125 MG PO TABS: 1.0000 | ORAL_TABLET | Freq: Two times a day (BID) | ORAL | 0 refills | Status: AC

## 2022-01-14 NOTE — Progress Notes (Signed)
Virtual Visit Consent   [name] [name], you are scheduled for a virtual visit with a [facility] provider today. Just as with appointments in the office, your consent must be obtained to participate. Your consent will be active for this visit and any virtual visit you may have with one of our providers in the next 365 days. If you have a MyChart account, a copy of this consent can be sent to you electronically.  As this is a virtual visit, video technology does not allow for your provider to perform a traditional examination. This may limit your provider's ability to fully assess your condition. If your provider identifies any concerns that need to be evaluated in person or the need to arrange testing (such as labs, EKG, etc.), we will make arrangements to do so. Although advances in technology are sophisticated, we cannot ensure that it will always work on either your end or our end. If the connection with a video visit is poor, the visit may have to be switched to a telephone visit. With either a video or telephone visit, we are not always able to ensure that we have a secure connection.  By engaging in this virtual visit, you consent to the provision of healthcare and authorize for your insurance to be billed (if applicable) for the services provided during this visit. Depending on your insurance coverage, you may receive a charge related to this service.  I need to obtain your verbal consent now. Are you willing to proceed with your visit today? [name] [name] has provided verbal consent on 01/14/2022 for a virtual visit (video or telephone). [name] Loveless, PA-C  Date: 01/14/2022 11:33 AM  Virtual Visit via Video Note   I, [name] [name], connected with  [name] [name]  ([id number], [birth year]) on 01/14/22 at 11:30 AM EST by a video-enabled telemedicine application and verified that I am speaking with the correct person using two identifiers.  Location: Patient: Virtual Visit  Location Patient: Home Provider: Virtual Visit Location Provider: Home Office   I discussed the limitations of evaluation and management by telemedicine and the availability of in person appointments. The patient expressed understanding and agreed to proceed.    History of Present Illness: [name] [name] is a 36 y.o. who identifies as a female who was assigned female at birth, and is being seen today for flu-like symptoms.  HPI: URI  This is a new problem. The current episode started in the past 7 days (started Thursday morning). The problem has been gradually worsening. The maximum temperature recorded prior to her arrival was 102 - 102.9 F (had fever on Thursday and Saturday). The fever has been present for 1 to 2 days. Associated symptoms include congestion, coughing, ear pain (right), headaches, a plugged ear sensation (right), rhinorrhea (and post nasal drainage), sinus pain and a sore throat (mild from drainage). Pertinent negatives include no chest pain, diarrhea, nausea or vomiting. Associated symptoms comments: Myalgias, chills, fatigue. Treatments tried: dayquil, nyquil (helps sleep) The treatment provided no relief.     Problems:  Patient Active Problem List   Diagnosis Date Noted   Normal labor and delivery 06/02/2015    Allergies:  Allergies  Allergen Reactions   Reglan [Metoclopramide] Other (See Comments)    Severe dystonic reaction.   Sulfa Antibiotics Hives   Adhesive [Tape] Rash    Irritation where any adhesive is placed, even paper tape.   Dilaudid [Hydromorphone Hcl] Rash    Has been able to take  percocet and morphine with no problem.   Latex Rash    States causes irritation.   Medications:  Current Outpatient Medications:    amoxicillin-clavulanate (AUGMENTIN) 875-125 MG tablet, Take 1 tablet by mouth 2 (two) times daily., Disp: 20 tablet, Rfl: 0   benzonatate (TESSALON) 100 MG capsule, Take 1 capsule (100 mg total) by mouth 3 (three) times daily as needed.,  Disp: 30 capsule, Rfl: 0   fluticasone (FLONASE) 50 MCG/ACT nasal spray, Place 2 sprays into both nostrils daily., Disp: 16 g, Rfl: 0   promethazine-dextromethorphan (PROMETHAZINE-DM) 6.25-15 MG/5ML syrup, Take 5 mLs by mouth 4 (four) times daily as needed., Disp: 118 mL, Rfl: 0   ibuprofen (ADVIL,MOTRIN) 600 MG tablet, Take 1 tablet (600 mg total) by mouth every 6 (six) hours., Disp: 30 tablet, Rfl: 0   ondansetron (ZOFRAN-ODT) 8 MG disintegrating tablet, Take 8 mg by mouth every 8 (eight) hours as needed for nausea or vomiting., Disp: , Rfl:    oxyCODONE-acetaminophen (PERCOCET/ROXICET) 5-325 MG tablet, Take 1 tablet by mouth every 4 (four) hours as needed for severe pain (pain scale 4-7)., Disp: 20 tablet, Rfl: 0   Prenatal Vit-Fe Fumarate-FA (PRENATAL MULTIVITAMIN) TABS tablet, Take 1 tablet by mouth daily at 12 noon., Disp: , Rfl:    Observations/Objective: Patient is well-developed, well-nourished in no acute distress.  Resting comfortably at home.  Head is normocephalic, atraumatic.  No labored breathing.  Speech is clear and coherent with logical content.  Patient is alert and oriented at baseline.    Assessment and Plan: 1. Acute bacterial sinusitis - amoxicillin-clavulanate (AUGMENTIN) 875-125 MG tablet; Take 1 tablet by mouth 2 (two) times daily.  Dispense: 20 tablet; Refill: 0 - fluticasone (FLONASE) 50 MCG/ACT nasal spray; Place 2 sprays into both nostrils daily.  Dispense: 16 g; Refill: 0  2. Ear infection - amoxicillin-clavulanate (AUGMENTIN) 875-125 MG tablet; Take 1 tablet by mouth 2 (two) times daily.  Dispense: 20 tablet; Refill: 0  3. Acute cough - promethazine-dextromethorphan (PROMETHAZINE-DM) 6.25-15 MG/5ML syrup; Take 5 mLs by mouth 4 (four) times daily as needed.  Dispense: 118 mL; Refill: 0 - benzonatate (TESSALON) 100 MG capsule; Take 1 capsule (100 mg total) by mouth 3 (three) times daily as needed.  Dispense: 30 capsule; Refill: 0  - Worsening symptoms that  have not responded to OTC medications.  - Will give Augmentin, Flonase, Promethazine DM, and Tessalon perles - Continue allergy medications.  - Steam and humidifier can help - Stay well hydrated and get plenty of rest.  - Seek in person evaluation if no symptom improvement or if symptoms worsen   Follow Up Instructions: I discussed the assessment and treatment plan with the patient. The patient was provided an opportunity to ask questions and all were answered. The patient agreed with the plan and demonstrated an understanding of the instructions.  A copy of instructions were sent to the patient via MyChart unless otherwise noted below.    The patient was advised to call back or seek an in-person evaluation if the symptoms worsen or if the condition fails to improve as anticipated.  Time:  I spent 10 minutes with the patient via telehealth technology discussing the above problems/concerns.    [name] Loveless, PA-C

## 2022-01-14 NOTE — Patient Instructions (Signed)
[name] [name], thank you for joining Margaretann Loveless, PA-C for today's virtual visit.  While this provider is not your primary care provider (PCP), if your PCP is located in our provider database this encounter information will be shared with them immediately following your visit.   A [facility] MyChart account gives you access to today's visit and all your visits, tests, and labs performed at [facility] " click here if you don't have a [facility] [place] MyChart account or go to [email or web address]  Consent: (Patient) [name] [name] provided verbal consent for this virtual visit at the beginning of the encounter.  Current Medications:  Current Outpatient Medications:    amoxicillin-clavulanate (AUGMENTIN) 875-125 MG tablet, Take 1 tablet by mouth 2 (two) times daily., Disp: 20 tablet, Rfl: 0   benzonatate (TESSALON) 100 MG capsule, Take 1 capsule (100 mg total) by mouth 3 (three) times daily as needed., Disp: 30 capsule, Rfl: 0   fluticasone (FLONASE) 50 MCG/ACT nasal spray, Place 2 sprays into both nostrils daily., Disp: 16 g, Rfl: 0   promethazine-dextromethorphan (PROMETHAZINE-DM) 6.25-15 MG/5ML syrup, Take 5 mLs by mouth 4 (four) times daily as needed., Disp: 118 mL, Rfl: 0   ibuprofen (ADVIL,MOTRIN) 600 MG tablet, Take 1 tablet (600 mg total) by mouth every 6 (six) hours., Disp: 30 tablet, Rfl: 0   ondansetron (ZOFRAN-ODT) 8 MG disintegrating tablet, Take 8 mg by mouth every 8 (eight) hours as needed for nausea or vomiting., Disp: , Rfl:    oxyCODONE-acetaminophen (PERCOCET/ROXICET) 5-325 MG tablet, Take 1 tablet by mouth every 4 (four) hours as needed for severe pain (pain scale 4-7)., Disp: 20 tablet, Rfl: 0   Prenatal Vit-Fe Fumarate-FA (PRENATAL MULTIVITAMIN) TABS tablet, Take 1 tablet by mouth daily at 12 noon., Disp: , Rfl:    Medications ordered in this encounter:  Meds ordered this encounter  Medications   amoxicillin-clavulanate (AUGMENTIN) 875-125 MG tablet     Sig: Take 1 tablet by mouth 2 (two) times daily.    Dispense:  20 tablet    Refill:  0    Order Specific Question:   Supervising Provider    Answer:   Merrilee Jansky [[id number]]   fluticasone (FLONASE) 50 MCG/ACT nasal spray    Sig: Place 2 sprays into both nostrils daily.    Dispense:  16 g    Refill:  0    Order Specific Question:   Supervising Provider    Answer:   Merrilee Jansky [id number]   promethazine-dextromethorphan (PROMETHAZINE-DM) 6.25-15 MG/5ML syrup    Sig: Take 5 mLs by mouth 4 (four) times daily as needed.    Dispense:  118 mL    Refill:  0    Order Specific Question:   Supervising Provider    Answer:   Merrilee Jansky [[id number]]   benzonatate (TESSALON) 100 MG capsule    Sig: Take 1 capsule (100 mg total) by mouth 3 (three) times daily as needed.    Dispense:  30 capsule    Refill:  0    Order Specific Question:   Supervising Provider    Answer:   Merrilee Jansky [id number]     *If you need refills on other medications prior to your next appointment, please contact your pharmacy*  Follow-Up: Call back or seek an in-person evaluation if the symptoms worsen or if the condition fails to improve as anticipated.  [facility] [phone]  Other Instructions  Otitis Media, Adult  Otitis media is a  condition in which the middle ear is red and swollen (inflamed) and full of fluid. The middle ear is the part of the ear that contains bones for hearing as well as air that helps send sounds to the brain. The condition usually goes away on its own. What are the causes? This condition is caused by a blockage in the eustachian tube. This tube connects the middle ear to the back of the nose. It normally allows air into the middle ear. The blockage is caused by fluid or swelling. Problems that can cause blockage include: A cold or infection that affects the nose, mouth, or throat. Allergies. An irritant, such as tobacco smoke. Adenoids that have  become large. The adenoids are soft tissue located in the back of the throat, behind the nose and the roof of the mouth. Growth or swelling in the upper part of the throat, just behind the nose (nasopharynx). Damage to the ear caused by a change in pressure. This is called barotrauma. What increases the risk? You are more likely to develop this condition if you: Smoke or are exposed to tobacco smoke. Have an opening in the roof of your mouth (cleft palate). Have acid reflux. Have problems in your body's defense system (immune system). What are the signs or symptoms? Symptoms of this condition include: Ear pain. Fever. Problems with hearing. Being tired. Fluid leaking from the ear. Ringing in the ear. How is this treated? This condition can go away on its own within 3-5 days. But if the condition is caused by germs (bacteria) and does not go away on its own, or if it keeps coming back, your doctor may: Give you antibiotic medicines. Give you medicines for pain. Follow these instructions at home: Take over-the-counter and prescription medicines only as told by your doctor. If you were prescribed an antibiotic medicine, take it as told by your doctor. Do not stop taking it even if you start to feel better. Keep all follow-up visits. Contact a doctor if: You have bleeding from your nose. There is a lump on your neck. You are not feeling better in 5 days. You feel worse instead of better. Get help right away if: You have pain that is not helped with medicine. You have swelling, redness, or pain around your ear. You get a stiff neck. You cannot move part of your face (paralysis). You notice that the bone behind your ear hurts when you touch it. You get a very bad headache. Summary Otitis media means that the middle ear is red, swollen, and full of fluid. This condition usually goes away on its own. If the problem does not go away, treatment may be needed. You may be given medicines  to treat the infection or to treat your pain. If you were prescribed an antibiotic medicine, take it as told by your doctor. Do not stop taking it even if you start to feel better. Keep all follow-up visits. This information is not intended to replace advice given to you by your health care provider. Make sure you discuss any questions you have with your health care provider. Document Revised: 04/11/2020 Document Reviewed: 04/11/2020 Elsevier Patient Education  2023 [employer]   Sinus Infection, Adult A sinus infection is soreness and swelling (inflammation) of your sinuses. Sinuses are hollow spaces in the bones around your face. They are located: Around your eyes. In the middle of your forehead. Behind your nose. In your cheekbones. Your sinuses and nasal passages are lined with a fluid  called mucus. Mucus drains out of your sinuses. Swelling can trap mucus in your sinuses. This lets germs (bacteria, virus, or fungus) grow, which leads to infection. Most of the time, this condition is caused by a virus. What are the causes? Allergies. Asthma. Germs. Things that block your nose or sinuses. Growths in the nose (nasal polyps). Chemicals or irritants in the air. A fungus. This is rare. What increases the risk? Having a weak body defense system (immune system). Doing a lot of swimming or diving. Using nasal sprays too much. Smoking. What are the signs or symptoms? The main symptoms of this condition are pain and a feeling of pressure around the sinuses. Other symptoms include: Stuffy nose (congestion). This may make it hard to breathe through your nose. Runny nose (drainage). Soreness, swelling, and warmth in the sinuses. A cough that may get worse at night. Being unable to smell and taste. Mucus that collects in the throat or the back of the nose (postnasal drip). This may cause a sore throat or bad breath. Being very tired (fatigued). A fever. How is this diagnosed? Your  symptoms. Your medical history. A physical exam. Tests to find out if your condition is short-term (acute) or long-term (chronic). Your doctor may: Check your nose for growths (polyps). Check your sinuses using a tool that has a light on one end (endoscope). Check for allergies or germs. Do imaging tests, such as an MRI or CT scan. How is this treated? Treatment for this condition depends on the cause and whether it is short-term or long-term. If caused by a virus, your symptoms should go away on their own within 10 days. You may be given medicines to relieve symptoms. They include: Medicines that shrink swollen tissue in the nose. A spray that treats swelling of the nostrils. Rinses that help get rid of thick mucus in your nose (nasal saline washes). Medicines that treat allergies (antihistamines). Over-the-counter pain relievers. If caused by bacteria, your doctor may wait to see if you will get better without treatment. You may be given antibiotic medicine if you have: A very bad infection. A weak body defense system. If caused by growths in the nose, surgery may be needed. Follow these instructions at home: Medicines Take, use, or apply over-the-counter and prescription medicines only as told by your doctor. These may include nasal sprays. If you were prescribed an antibiotic medicine, take it as told by your doctor. Do not stop taking it even if you start to feel better. Hydrate and humidify  Drink enough water to keep your pee (urine) pale yellow. Use a cool mist humidifier to keep the humidity level in your home above 50%. Breathe in steam for 10-15 minutes, 3-4 times a day, or as told by your doctor. You can do this in the bathroom while a hot shower is running. Try not to spend time in cool or dry air. Rest Rest as much as you can. Sleep with your head raised (elevated). Make sure you get enough sleep each night. General instructions  Put a warm, moist washcloth on your  face 3-4 times a day, or as often as told by your doctor. Use nasal saline washes as often as told by your doctor. Wash your hands often with soap and water. If you cannot use soap and water, use hand sanitizer. Do not smoke. Avoid being around people who are smoking (secondhand smoke). Keep all follow-up visits. Contact a doctor if: You have a fever. Your symptoms get worse. Your  symptoms do not get better within 10 days. Get help right away if: You have a very bad headache. You cannot stop vomiting. You have very bad pain or swelling around your face or eyes. You have trouble seeing. You feel confused. Your neck is stiff. You have trouble breathing. These symptoms may be an emergency. Get help right away. Call 911. Do not wait to see if the symptoms will go away. Do not drive yourself to the hospital. Summary A sinus infection is swelling of your sinuses. Sinuses are hollow spaces in the bones around your face. This condition is caused by tissues in your nose that become inflamed or swollen. This traps germs. These can lead to infection. If you were prescribed an antibiotic medicine, take it as told by your doctor. Do not stop taking it even if you start to feel better. Keep all follow-up visits. This information is not intended to replace advice given to you by your health care provider. Make sure you discuss any questions you have with your health care provider. Document Revised: 12/06/2020 Document Reviewed: 12/06/2020 Elsevier Patient Education  2023 [employer]    If you have been instructed to have an in-person evaluation today at a local Urgent Care facility, please use the link below. It will take you to a list of all of our available [facility], including address, phone number and hours of operation. Please do not delay care.  [facility]  If you or a family member do not have a primary care provider, use the link below to schedule a visit  and establish care. When you choose a [facility] primary care physician or advanced practice provider, you gain a long-term partner in health. Find a Primary Care Provider  Learn more about [facility]'s in-office and virtual care options: [facility] Get Care Now

## 2022-01-29 DIAGNOSIS — Z01419 Encounter for gynecological examination (general) (routine) without abnormal findings: Secondary | ICD-10-CM | POA: Diagnosis not present

## 2022-01-29 DIAGNOSIS — Z6823 Body mass index (BMI) 23.0-23.9, adult: Secondary | ICD-10-CM | POA: Diagnosis not present

## 2023-03-04 DIAGNOSIS — Z6823 Body mass index (BMI) 23.0-23.9, adult: Secondary | ICD-10-CM | POA: Diagnosis not present

## 2023-03-04 DIAGNOSIS — Z131 Encounter for screening for diabetes mellitus: Secondary | ICD-10-CM | POA: Diagnosis not present

## 2023-03-04 DIAGNOSIS — Z130 Encounter for screening for diseases of the blood and blood-forming organs and certain disorders involving the immune mechanism: Secondary | ICD-10-CM | POA: Diagnosis not present

## 2023-03-04 DIAGNOSIS — R87610 Atypical squamous cells of undetermined significance on cytologic smear of cervix (ASC-US): Secondary | ICD-10-CM | POA: Diagnosis not present

## 2023-03-04 DIAGNOSIS — Z13228 Encounter for screening for other metabolic disorders: Secondary | ICD-10-CM | POA: Diagnosis not present

## 2023-03-04 DIAGNOSIS — Z01419 Encounter for gynecological examination (general) (routine) without abnormal findings: Secondary | ICD-10-CM | POA: Diagnosis not present

## 2023-03-04 DIAGNOSIS — Z1329 Encounter for screening for other suspected endocrine disorder: Secondary | ICD-10-CM | POA: Diagnosis not present

## 2023-03-04 DIAGNOSIS — Z13220 Encounter for screening for lipoid disorders: Secondary | ICD-10-CM | POA: Diagnosis not present

## 2023-04-01 DIAGNOSIS — Z1239 Encounter for other screening for malignant neoplasm of breast: Secondary | ICD-10-CM | POA: Diagnosis not present
